# Patient Record
Sex: Male | Born: 1960 | Race: Black or African American | Hispanic: No | Marital: Single
Health system: Southern US, Community
[De-identification: ages and names within clinical notes are randomized; demographics above are authoritative.]

## PROBLEM LIST (undated history)

## (undated) DIAGNOSIS — E119 Type 2 diabetes mellitus without complications: Secondary | ICD-10-CM

---

## 2013-03-28 ENCOUNTER — Inpatient Hospital Stay: Payer: Self-pay | Admitting: Specialist

## 2013-03-28 LAB — CBC
HCT: 40.4 % (ref 40.0–52.0)
HGB: 13.5 g/dL (ref 13.0–18.0)
MCH: 28.3 pg (ref 26.0–34.0)
MCHC: 33.5 g/dL (ref 32.0–36.0)
MCV: 85 fL (ref 80–100)
Platelet: 220 10*3/uL (ref 150–440)
RBC: 4.78 10*6/uL (ref 4.40–5.90)
RDW: 13.1 % (ref 11.5–14.5)
WBC: 8.2 10*3/uL (ref 3.8–10.6)

## 2013-03-28 LAB — CK TOTAL AND CKMB (NOT AT ARMC)
CK, TOTAL: 190 U/L (ref 35–232)
CK, Total: 153 U/L (ref 35–232)
CK, Total: 144 U/L (ref 35–232)
CK-MB: 2 ng/mL (ref 0.5–3.6)
CK-MB: 1.8 ng/mL (ref 0.5–3.6)
CK-MB: 2.6 ng/mL (ref 0.5–3.6)

## 2013-03-28 LAB — BASIC METABOLIC PANEL
Anion Gap: 7 (ref 7–16)
BUN: 14 mg/dL (ref 7–18)
CO2: 27 mmol/L (ref 21–32)
CREATININE: 1.33 mg/dL — AB (ref 0.60–1.30)
Calcium, Total: 8.8 mg/dL (ref 8.5–10.1)
Chloride: 105 mmol/L (ref 98–107)
EGFR (Non-African Amer.): >60
Glucose: 121 mg/dL — ABNORMAL HIGH (ref 65–99)
Osmolality: 279 (ref 275–301)
Potassium: 3.4 mmol/L — ABNORMAL LOW (ref 3.5–5.1)
Sodium: 139 mmol/L (ref 136–145)

## 2013-03-28 LAB — TROPONIN I
TROPONIN-I: 0.08 ng/mL — AB
TROPONIN-I: 0.05 ng/mL
Troponin-I: 0.06 ng/mL — ABNORMAL HIGH

## 2013-03-29 LAB — CBC WITH DIFFERENTIAL/PLATELET
BASOS ABS: 0 10*3/uL (ref 0.0–0.1)
Basophil %: 0.5 %
EOS PCT: 2.8 %
Eosinophil #: 0.2 10*3/uL (ref 0.0–0.7)
HCT: 37.2 % — ABNORMAL LOW (ref 40.0–52.0)
HGB: 12.4 g/dL — AB (ref 13.0–18.0)
LYMPHS PCT: 39.4 %
Lymphocyte #: 2.8 10*3/uL (ref 1.0–3.6)
MCH: 28.2 pg (ref 26.0–34.0)
MCHC: 33.3 g/dL (ref 32.0–36.0)
MCV: 85 fL (ref 80–100)
MONO ABS: 0.5 x10 3/mm (ref 0.2–1.0)
MONOS PCT: 7.6 %
Neutrophil #: 3.5 10*3/uL (ref 1.4–6.5)
Neutrophil %: 49.7 %
Platelet: 198 10*3/uL (ref 150–440)
RBC: 4.4 10*6/uL (ref 4.40–5.90)
RDW: 13.6 % (ref 11.5–14.5)
WBC: 7.1 10*3/uL (ref 3.8–10.6)

## 2013-03-29 LAB — BASIC METABOLIC PANEL
Anion Gap: 3 — ABNORMAL LOW (ref 7–16)
BUN: 15 mg/dL (ref 7–18)
CHLORIDE: 107 mmol/L (ref 98–107)
Calcium, Total: 8.4 mg/dL — ABNORMAL LOW (ref 8.5–10.1)
Co2: 27 mmol/L (ref 21–32)
Creatinine: 0.98 mg/dL (ref 0.60–1.30)
GLUCOSE: 106 mg/dL — AB (ref 65–99)
Osmolality: 275 (ref 275–301)
POTASSIUM: 3.5 mmol/L (ref 3.5–5.1)
Sodium: 137 mmol/L (ref 136–145)

## 2013-03-29 LAB — MAGNESIUM: Magnesium: 1.8 mg/dL

## 2013-05-05 ENCOUNTER — Observation Stay: Payer: Self-pay | Admitting: Internal Medicine

## 2013-05-05 LAB — BASIC METABOLIC PANEL
ANION GAP: 6 — AB (ref 7–16)
BUN: 15 mg/dL (ref 7–18)
CALCIUM: 8.5 mg/dL (ref 8.5–10.1)
CHLORIDE: 108 mmol/L — AB (ref 98–107)
CREATININE: 1.31 mg/dL — AB (ref 0.60–1.30)
Co2: 26 mmol/L (ref 21–32)
EGFR (African American): >60
EGFR (Non-African Amer.): >60
Glucose: 96 mg/dL (ref 65–99)
Osmolality: 280 (ref 275–301)
Potassium: 3.9 mmol/L (ref 3.5–5.1)
SODIUM: 140 mmol/L (ref 136–145)

## 2013-05-05 LAB — CBC
HCT: 39 % — ABNORMAL LOW (ref 40.0–52.0)
HGB: 12.8 g/dL — AB (ref 13.0–18.0)
MCH: 28.4 pg (ref 26.0–34.0)
MCHC: 32.8 g/dL (ref 32.0–36.0)
MCV: 87 fL (ref 80–100)
PLATELETS: 224 10*3/uL (ref 150–440)
RBC: 4.51 10*6/uL (ref 4.40–5.90)
RDW: 14.7 % — ABNORMAL HIGH (ref 11.5–14.5)
WBC: 10.4 10*3/uL (ref 3.8–10.6)

## 2013-05-05 LAB — PRO B NATRIURETIC PEPTIDE: B-Type Natriuretic Peptide: 2025 pg/mL — ABNORMAL HIGH (ref 0–125)

## 2013-05-05 LAB — TROPONIN I
Troponin-I: 0.03 ng/mL
Troponin-I: 0.04 ng/mL
Troponin-I: 0.05 ng/mL

## 2013-05-06 LAB — LIPID PANEL
CHOLESTEROL: 185 mg/dL (ref 0–200)
HDL Cholesterol: 38 mg/dL — ABNORMAL LOW (ref 40–60)
LDL CHOLESTEROL, CALC: 121 mg/dL — AB (ref 0–100)
Triglycerides: 128 mg/dL (ref 0–200)
VLDL Cholesterol, Calc: 26 mg/dL (ref 5–40)

## 2013-05-06 LAB — BASIC METABOLIC PANEL
Anion Gap: 3 — ABNORMAL LOW (ref 7–16)
BUN: 22 mg/dL — ABNORMAL HIGH (ref 7–18)
Calcium, Total: 8.6 mg/dL (ref 8.5–10.1)
Chloride: 105 mmol/L (ref 98–107)
Co2: 30 mmol/L (ref 21–32)
Creatinine: 1.23 mg/dL (ref 0.60–1.30)
EGFR (Non-African Amer.): >60
Glucose: 85 mg/dL (ref 65–99)
Osmolality: 278 (ref 275–301)
POTASSIUM: 3.4 mmol/L — AB (ref 3.5–5.1)
SODIUM: 138 mmol/L (ref 136–145)

## 2013-05-06 LAB — CBC WITH DIFFERENTIAL/PLATELET
BASOS ABS: 0 10*3/uL (ref 0.0–0.1)
Basophil %: 0.4 %
EOS PCT: 1.6 %
Eosinophil #: 0.2 10*3/uL (ref 0.0–0.7)
HCT: 38.8 % — ABNORMAL LOW (ref 40.0–52.0)
HGB: 13 g/dL (ref 13.0–18.0)
LYMPHS PCT: 37.3 %
Lymphocyte #: 4.1 10*3/uL — ABNORMAL HIGH (ref 1.0–3.6)
MCH: 28.9 pg (ref 26.0–34.0)
MCHC: 33.4 g/dL (ref 32.0–36.0)
MCV: 87 fL (ref 80–100)
Monocyte #: 0.8 x10 3/mm (ref 0.2–1.0)
Monocyte %: 6.9 %
NEUTROS PCT: 53.8 %
Neutrophil #: 5.9 10*3/uL (ref 1.4–6.5)
Platelet: 227 10*3/uL (ref 150–440)
RBC: 4.49 10*6/uL (ref 4.40–5.90)
RDW: 15 % — AB (ref 11.5–14.5)
WBC: 10.9 10*3/uL — ABNORMAL HIGH (ref 3.8–10.6)

## 2014-05-03 IMAGING — CR DG CHEST 2V
1 series · 2 of 2 positions shown · non-contrast
Comparison: March 28, 2013

CLINICAL DATA: Chest pain

EXAM:
CHEST  2 VIEW

[Series 1: w chest pa · 0.14mm/px · 2 of 2 slices shown]
[im 1/2]
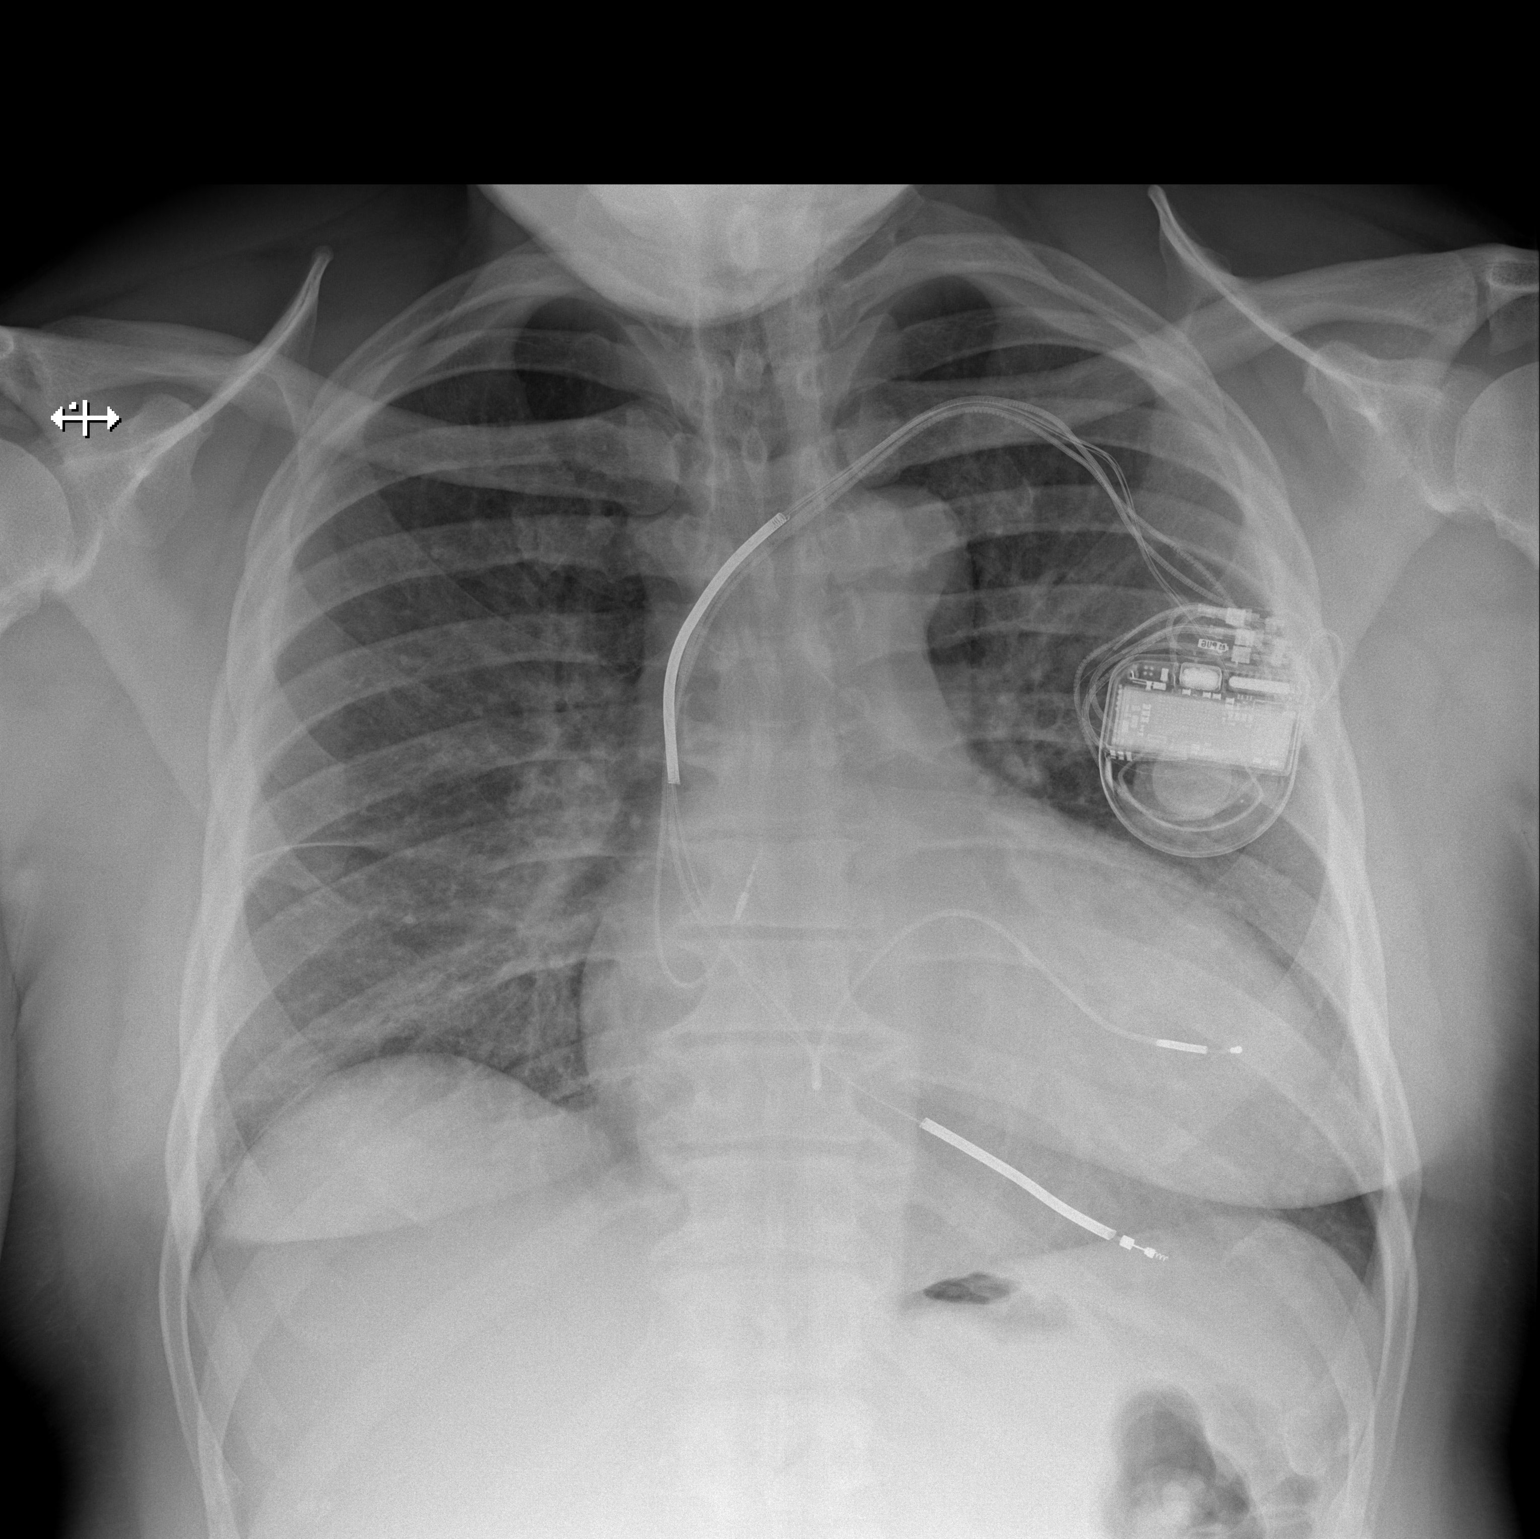
[im 2/2]
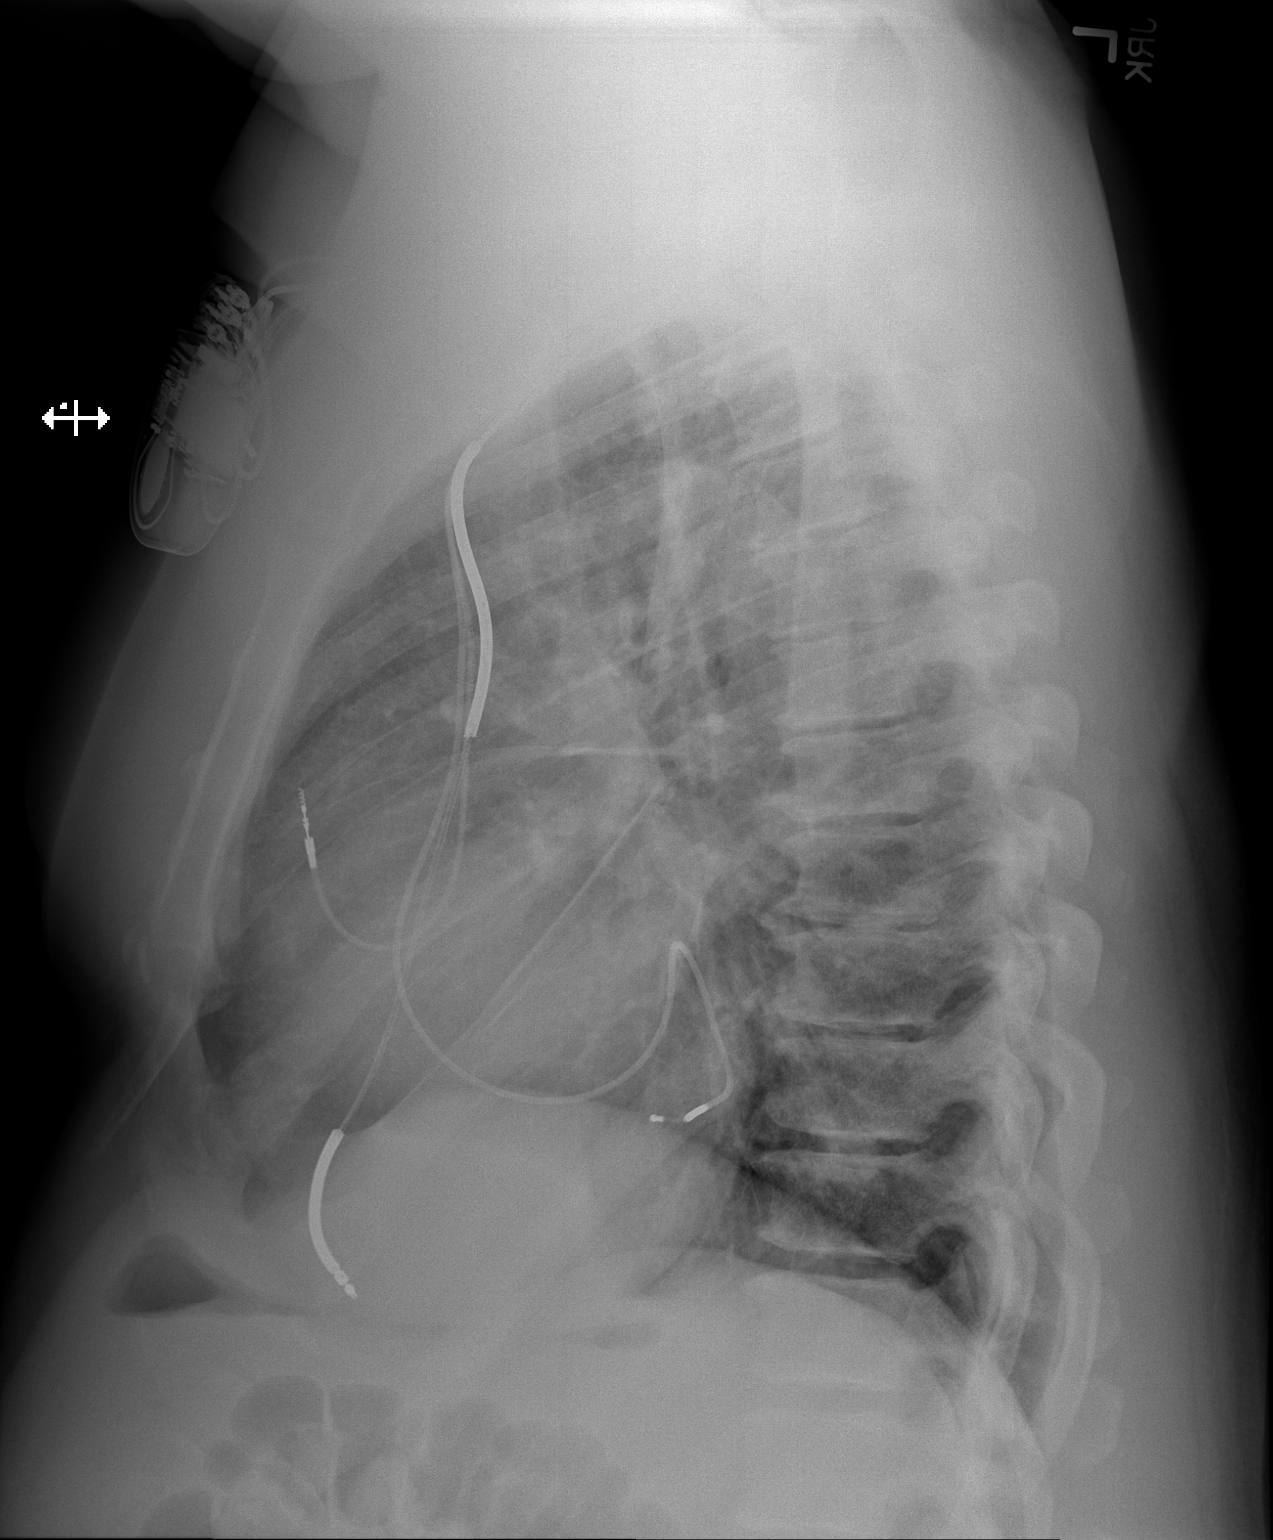

[2 of 2 positions shown; findings below may reference images not displayed]

FINDINGS: The mediastinal contour is normal. The heart size enlarged. Cardiac
pacemaker is unchanged. There is pulmonary edema. There is no focal
pneumonia or pleural effusion. The visualized skeletal structures
are stable.
IMPRESSION: Congestive heart failure.
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
## 2014-07-18 NOTE — H&P (Signed)
PATIENT NAME:  TAILOR, WESTFALL MR#:  [id number] DATE OF BIRTH:  March 09, 1961  DATE OF ADMISSION:  05/05/2013  PRIMARY CARE PHYSICIAN:  At [facility].   CARDIOLOGIST: Dr. Lady Gary.   CHIEF COMPLAINT: Not feeling good.   HISTORY OF PRESENT ILLNESS: This is a 54 year old man who states that he has not been feeling well. He has been having chest pain, needlelike in his chest, 5/10 in intensity, constant since yesterday, worse after eating. He has been having this chronic hip pain and difficulty getting around. He has had vomiting x1 and also some shortness of breath and some sweating.   In the ER, chest x-ray was positive for heart failure. BNP was elevated.   Of note, the patient has been off his Lasix for least a week. He states he does take some of the other medications.   When I evaluated the patient, the patient's pulse ox was good on room air. No respiratory distress. Will bring in as an observation.   PAST MEDICAL HISTORY: Nonischemic cardiomyopathy, diabetes, hypertension, hyperlipidemia.   PAST SURGICAL HISTORY: AICD.   ALLERGIES: No known drug allergies.   MEDICATIONS: As per prescription writer, include aspirin 81 mg daily, Coreg 25 mg twice a day, Lasix 40 mg daily, Imdur 30 mg daily. Lantus, he states 60 units, but in the prescription writer, it is down to 10 units. Lisinopril 20 mg daily, simvastatin 20 mg daily   SOCIAL HISTORY: Smokes 1 pack per three days. No alcohol. No drug use. Used to work as a Curator.   FAMILY HISTORY: Mother died of diabetic complications. Father died at age 5 of heart disease. Sister had a heart transplant, died at age 89.   REVIEW OF SYSTEMS:  CONSTITUTIONAL: Positive for sweating. No fever. No chills. Positive for fatigue.  EYES: Does wear glasses.  ENT: No hearing loss. No sore throat. No difficulty swallowing.  CARDIOVASCULAR: Positive for chest pain. No palpitations.  RESPIRATORY: Positive for shortness of breath. Positive for cough. No sputum. No  hemoptysis.  GASTROINTESTINAL: Positive for nausea. Positive vomiting. No abdominal pain. No diarrhea. No constipation. No bright red blood per rectum. No melena.  GENITOURINARY: No burning on urination. No hematuria.  MUSCULOSKELETAL: Positive for right hip pain and groin pain going down the front of the leg down to the foot, needlelike.  PSYCHIATRIC: History of depression.  INTEGUMENT: No rashes or eruptions.  NEUROLOGIC: No fainting or blackouts.  ENDOCRINE: No thyroid problems.  HEMOLYMPHATIC: No anemia. No easy bruising or bleeding.   PHYSICAL EXAMINATION:  VITAL SIGNS: Temperature 97.6, pulse 84, respirations 20, blood pressure 146/78, pulse oximetry 96% on room air.  GENERAL: No respiratory distress.  EYES: Conjunctivae and lids normal. Pupils equal, round, and reactive to light. Extraocular muscles intact. No nystagmus.  ENT: Tympanic membranes: No erythema. Nasal mucosa: No erythema.  THROAT: No erythema, no exudate seen.  LIPS AND GUMS: No lesions.  NECK: No JVD. No bruits. No lymphadenopathy. No thyromegaly. No thyroid nodules palpated.  LUNGS: Clear to auscultation. No use of accessory muscles to breathe. No rhonchi, rales, or wheeze heard.  CARDIOVASCULAR: S1, S2 normal. No gallops, rubs, or murmurs heard. Carotid upstroke 2+ bilaterally. No bruits. Dorsalis pedal pulses 2+ bilaterally. Trace edema of the lower extremity. CHEST WALL: Positive pain to palpation of the xiphoid process.  ABDOMEN: Soft, nontender. No organomegaly/splenomegaly. Normoactive bowel sounds. No masses felt.  LYMPHATIC: No lymph nodes in the neck.  MUSCULOSKELETAL: Trace edema. No clubbing. No cyanosis. Positive pain to right hip area when  flexing and internally rotating.  SKIN: No ulcers or lesions seen.  NEUROLOGIC: Cranial nerves II through XII grossly intact. Deep tendon reflexes 2+ bilateral lower extremities.  PSYCHIATRIC: Oriented to person, place, and time.   LABORATORY AND RADIOLOGICAL DATA:  Glucose 96, BUN 15, creatinine 1.31, sodium 140, potassium 3.9, chloride 108, CO2 26, calcium 8.5. White blood cell count 10.4, H and H  12.8 and 39.0, platelet count of 224. Troponin negative.   Chest x-ray shows heart failure.   BNP 2025, second troponin was actually negative also.   EKG: Paced.   ASSESSMENT AND PLAN:  1.  Acute-on-chronic systolic congestive heart failure with cardiomyopathy. I did see the patient after the Lasix. I will give another dose of IV Lasix today, restart his medications, admit as an observation, get a cardiology consultation, get a third cardiac enzyme. Close clinical follow-up as outpatient will be needed upon discharge. Compliance with medication will be needed or else the patient will have numerous hospitalizations.  2.  Chest pain, likely inflammatory process of the xiphoid. I will give 1 dose of prednisone today. Could also be gastroesophageal reflux disease. We will give proton pump inhibitor.  3.  Diabetes. Prescription writer says he takes 10 units of Lantus. Will start with that and sliding scale.  4.  Hypertension. Blood pressure is slightly elevated. We will restart medications and monitor.  5.  Hyperlipidemia. Continue Zocor.  6.  Chronic right hip pain. We will get an x-ray and physical therapy evaluation.   TIME SPENT ON ADMISSION: 55 minutes.   ____________________________ Herschell Dimesichard J. Renae GlossWieting, MD rjw:np D: 05/05/2013 15:43:45 ET T: 05/05/2013 16:15:00 ET JOB#: [id number]  cc: Herschell Dimesichard J. Renae GlossWieting, MD, <Dictator> Darlin PriestlyKenneth A. Lady GaryFath, MD Salley ScarletICHARD J Konstantina Nachreiner MD ELECTRONICALLY SIGNED 05/17/2013 12:16

## 2014-07-18 NOTE — Consult Note (Signed)
   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 He was recently admitted in December with similar complaints. He had been off of all of his meds for at least a month at that time. He was placed back on his meds. He now returns with increased shortness of breath. CXR suggests mild pulmonary edema. He is able to lay flat however. He states his hips are hurting also. He had some chest pain but cardiac [place] in [place] in early December revealed no significant disease. Symptoms are likley secondary to noncompliance.   Physical Exam:  GEN obese   HEENT PERRL, moist oral mucosa   NECK supple  No masses   RESP normal resp effort  clear BS   CARD Regular rate and rhythm  Murmur   Murmur Systolic   Systolic Murmur axilla   ABD denies tenderness  normal BS   LYMPH negative neck, negative axillae   EXTR negative cyanosis/clubbing, positive edema   SKIN normal to palpation   NEURO cranial nerves intact, motor/sensory function intact   PSYCH A+O to time, place, person   Review of Systems:  Subjective/Chief Complaint shortness of breath and chest pain   General: No Complaints  Fatigue   Skin: No Complaints   ENT: No Complaints   Eyes: No Complaints   Neck: No Complaints   Respiratory: Short of breath   Cardiovascular: Chest pain or discomfort  Dyspnea  Edema   Gastrointestinal: No Complaints   Genitourinary: No Complaints   Vascular: No Complaints   Musculoskeletal: No Complaints   Neurologic: No Complaints   Hematologic: No Complaints   Endocrine: No Complaints   Psychiatric: No Complaints   Review of Systems: All other systems were reviewed and found to be negative   Medications/Allergies Reviewed Medications/Allergies reviewed   Home Medications: Medication Instructions Status  isosorbide mononitrate 30 mg oral tablet, extended release 1 tab(s) orally once a day (in  the morning) Active  furosemide 40 mg oral tablet 1 tab(s) orally once a day Active  Aspirin Enteric Coated 81 mg oral delayed release tablet 1 tab(s) orally once a day Active  carvedilol 25 mg oral tablet 1 tab(s) orally 2 times a day Active  simvastatin 20 mg oral tablet 1 tab(s) orally once a day (at bedtime) Active  lisinopril 20 mg oral tablet 1 tab(s) orally once a day Active  Lantus Solostar Pen 100 units/mL subcutaneous solution 10  subcutaneous once a day Active   EKG:  Abnormal NSSTTW changes    No Known Allergies:    Impression 54 yo male with history of idiopathic cardiomyopathy who is admitted iwth progressive shortness of breath. He states he has been noncompliant with his meds. He has imporved with iv lasix. WOuld recommend resuming his home meds after several doses of  iv lasix. Discussed importance of taking meds.   Plan 1. Resume beta blockers, nitreates diuretics 2. Low sodium diet. 3. Ambulate in am  4. Consider discharge if stable with outpatient follow up   Electronic Signatures: Dalia HeadingFath, Kenneth A (MD)  (Signed 09-Feb-15 15:21)  Authored: General Aspect/Present Illness, History and Physical Exam, Review of System, Home Medications, EKG , Allergies, Impression/Plan   Last Updated: 09-Feb-15 15:21 by Dalia HeadingFath, Kenneth A (MD)

## 2014-07-18 NOTE — Discharge Summary (Signed)
PATIENT NAME:  Devin Huber, Devin Huber MR#:  [id number] DATE OF BIRTH:  1960/07/21  DATE OF ADMISSION:  03/28/2013 DATE OF DISCHARGE:  03/29/2013  For a detailed note, please see the history and physical done on admission by Dr. Randol KernElgergawy.   DIAGNOSES AT DISCHARGE:  1.  Chest pain, likely musculoskeletal in nature. 2.  Elevated troponin, likely in the setting of demand ischemia and no evidence of acute coronary syndrome. 3.  A history of diabetes.  4.  A history of ischemic cardiomyopathy, ejection fraction of 25%. 5.  A history of chronic systolic congestive heart failure  6.  Hyperlipidemia.   DIET:  The patient is being discharged on American Diabetic Association low-sodium, low-fat diet.   ACTIVITY:  As tolerated.   FOLLOWUP:   Dr. Mariel KanskyKen Fath next 1 to 2 weeks.   DISCHARGE MEDICATIONS AS FOLLOWS: 1.  Imdur 30 mg daily.  2.  Lasix 40 mg daily.  3.  Aspirin 81 mg daily.  4.  Coreg 25 mg b.i.d.  5.  Celexa 10 mg daily.  6.  Lisinopril 5 mg daily.  7.  Simvastatin 20 mg daily. 8.  Lantus pen at bedtime.   CONSULTANTS DURING THE HOSPITAL COURSE:  Dr. Mariel KanskyKen Fath from Cardiology.   PERTINENT STUDIES DONE DURING THE HOSPITAL COURSE:  A CT scan of the chest done with contrast showing no evidence for acute pulmonary embolism, severe cardiomegaly with LVH. A chest x-ray done on admission showing no acute disease. An echocardiogram of the heart showing EF of 20% to 25%, severely decreased global LV systolic function, moderate to severely increased LV cavity size, moderate LVH.   HOSPITAL COURSE:  This is a 54 year old male with medical problems as mentioned above, presented to the hospital with chest pain.  1.  Chest pain. Given the patient's risk factors of diabetes and history of cardiomyopathy, the patient was admitted overnight and was observed on telemetry, had 3 sets of cardiac markers checked, which were negative. The patient is currently chest pain-free and hemodynamically stable. He also had a  recent cardiac catheter[place] in [place] a few weeks back, which showed no significant coronary artery disease amenable to intervention. The patient was seen by Cardiology who recommended medical management at this point. The patient is, therefore, being discharged on his aspirin, beta blocker, Imdur and statin. If he continues to have further chest pain, Ranexa could possibly be added.  2.  Hypertension. The patient remained hemodynamically stable on his Coreg and lisinopril. He will resume. 3.  Diabetes. The patient was maintained on sliding scale insulin and his Lantus. He will resume that.  4.  Depression. The patient was maintained on her Celexa. He will continue that.  5.  Hyperlipidemia. The patient was maintained on his simvastatin and he will resume that too.   The patient has recently [place] here from [place], does not have a primary care physician. He needs to get himself a primary care physician established. He understands this. This is likely to be done within next week or so. For now, he will follow up with Cardiology, Dr. Lady GaryFath, as an outpatient.   TIME SPENT:  40 minutes.   ____________________________ Rolly PancakeVivek J. Cherlynn KaiserSainani, MD vjs:jm D: 03/29/2013 16:06:19 ET T: 03/29/2013 16:42:45 ET JOB#: [id number]  cc: Rolly PancakeVivek J. Cherlynn KaiserSainani, MD, <Dictator> Darlin PriestlyKenneth A. Lady GaryFath, MD Houston SirenVIVEK J Nosson Wender MD ELECTRONICALLY SIGNED 04/23/2013 11:18

## 2014-07-18 NOTE — Consult Note (Signed)
Brief Consult Note: Diagnosis: 54 yo male with history of idiopathic cardiomyopathy admitted with chest pain and sob after traveling from [place]. Chest ct negative for pe. Trivial troponin elevation. Had radial appraoch cath in [place] recently showing no evidene of cad per his report. EF was 20-25%.   Patient was seen by consultant.   Recommend further assessment or treatment.   Comments: Pt with history of apparent idiiopathic cardiomyopathy after invasive and nonivansive workup in [place]. EF was 20-25%. He is on afterload reduction, carvedilol and lasix. Minimal troponin eelvation which appears to be seocnary to his chronic systollic chf. No chf on cxr. Would continue with carvedilol, ace i and lasix. AMbualte. Cath report not in packet that came from [place]. Will attempt to get this report.  Electronic Signatures: Dalia HeadingFath, Kenneth A (MD)  (Signed 02-Jan-15 15:38)  Authored: Brief Consult Note   Last Updated: 02-Jan-15 15:38 by Dalia HeadingFath, Kenneth A (MD)

## 2014-07-18 NOTE — H&P (Signed)
PATIENT NAME:  Devin Huber, FERRARI MR#:  [id number] DATE OF BIRTH:  [birth year]  DATE OF ADMISSION:  03/28/2013  REFERRING PHYSICIAN: Dr. Bayard Males.   PRIMARY CARE PHYSICIAN: The patient is visiting from [place], [place]. His physician is in [place].   CHIEF COMPLAINT: Chest pain.   HISTORY OF PRESENT ILLNESS: This is a 54 year old male with significant past medical history of coronary artery disease, status post myocardial infarction and AICD placement, diabetes, hypertension, hyperlipidemia, who presents with complaints of chest pain. The patient reports his pain started a few hours ago, midsternal, sharp quality and nonradiating. He reports some mild shortness of breath with it. He also reports some mild diaphoresis. Denies nausea, vomiting, sweating, palpitation, cough, productive sputum, hemoptysis, or weakness or dizziness. The patient reports he has been complaining of a different kind of chest pain in the past, where he reports he had a cardiac catheterization done last month in [place], which he was told it was negative. The patient's EKG showing paced rhythm. His first troponin is 0.08. The patient was given 324 mg of aspirin. He denies any chest pain currently. The patient reports he drove from [place] yesterday. He reported it took him more than 1 day as he was resting in between. He drove around 8 to 10 hours. He denies any calf tenderness, any hemoptysis or any history of deep vein thrombosis or PE in the past. Hospitalist services were requested to admit the patient for further evaluation of his chest pain.   PAST MEDICAL HISTORY:  1.  Coronary artery disease.  2.  Diabetes.  3.  Hypertension.  4.  Hyperlipidemia.  5.  AICD.   PAST SURGICAL HISTORY: AICD.    SOCIAL HISTORY: The patient smokes 1 pack per day lasting for up to 3 days. No alcohol. No illicit drug use.   FAMILY HISTORY: Negative for coronary artery disease.   ALLERGIES: No known drug allergies.   HOME  MEDICATIONS:   1.  Aspirin 81 mg oral daily.  2.  Lisinopril 5 mg oral daily.  3.  Isosorbide mononitrate 30 mg oral daily.  4.  Citalopram 10 mg oral daily.  5.  Lantus 20 units subcutaneous daily.  6.  Simvastatin 20 mg oral at bedtime.  7.  Coreg 25 mg oral 2 times a day.  8.  Lasix 40 mg oral daily.   REVIEW OF SYSTEMS: CONSTITUTIONAL: Denies fever, chills, fatigue, weakness, weight gain, weight loss. EYES: Denies blurry vision, double vision, inflammation, glaucoma. EARS, NOSE, THROAT: Denies tinnitus, ear pain, hearing loss or epistaxis. RESPIRATORY: Denies cough, wheezing, hemoptysis or chronic obstructive pulmonary disease. Reports mild dyspnea. CARDIOVASCULAR: Complains of chest pain, resolved. Denies any edema, arrhythmia, palpitations or syncope. GASTROINTESTINAL: Denies nausea, vomiting, diarrhea, abdominal pain, hematemesis or melena. GENITOURINARY: Denies dysuria, hematuria, or renal colic.  ENDOCRINE: Denies polyuria, polydipsia, heat or cold intolerance. HEMATOLOGY: Denies anemia, easy bruising or bleeding diathesis. INTEGUMENTARY: Denies acne, rash or skin lesion.  MUSCULOSKELETAL: Denies any arthritis, cramps or gout. NEUROLOGIC: Denies CVA, TIA, ataxia, dementia, headache. PSYCHIATRIC: Denies anxiety, insomnia or bipolar disorder.   PHYSICAL EXAMINATION:  VITAL SIGNS: Temperature 99.5, pulse 74, respiratory rate 18, blood pressure 99/47 and saturating  95% on room air.  GENERAL: Well-nourished male who looks comfortable in no apparent distress.  HEENT: Head atraumatic, normocephalic. Pupils equal and reactive to light. Pink conjunctivae. Anicteric sclerae. Moist oral mucosa.  NECK: Supple. No thyromegaly. No JVD.  CHEST: Good air entry bilaterally. No wheezing, rales, rhonchi.  CARDIOVASCULAR: S1, S2 heard. No rubs, murmurs or gallops.  ABDOMEN: Soft, nontender, nondistended. Bowel sounds present.  EXTREMITIES: +1 edema bilaterally. No clubbing. No cyanosis. Pedal pulses felt  bilaterally.  PSYCHIATRIC: Appropriate affect. Awake, alert x 3. Intact judgment and insight.  NEUROLOGIC: Cranial nerves grossly intact. Motor 5 out of 5. No focal deficits.  MUSCULOSKELETAL: No joint effusion or erythema. LYMPHATICS: No cervical lymphadenopathy.   PERTINENT LABS: Glucose 121, BUN 14, creatinine 1.33, sodium 139, potassium 3.4, chloride 105, CO2 27. Troponin 0.08. White blood cell 8.2, hemoglobin 13.5, hematocrit 40.4, platelet 220. EKG showing electronic paced rhythm at 79 beats per minute.   ASSESSMENT AND PLAN:  1.  Chest pain. The patient had musculoskeletal chest pain quality that is reproducible by palpation, but given his history of recent travel we will need to rule out pulmonary embolus, so we will obtain CT angiogram chest to rule out pulmonary embolus. As well, given the fact he has mild elevated troponin, he will be admitted to telemetry unit. Continue to cycle his cardiac enzymes and follow the trend. He will be given 324 mg of aspirin. Given the fact he has mildly  elevated troponin, but he reports he had normal cardiac catheter, so I will not start him on any subcutaneous Lovenox. I requested records from [place] and I am awaiting the records and will follow the trend. Will consult cardiology.  2.  Diabetes mellitus. We will hold his Lantus. We will have him on insulin sliding scale.  3.  Hypokalemia. We will replace.  4.  History of hypertension. His blood pressure is on the lower side. We will resume him back on lisinopril, Imdur and Coreg. We will hold Lasix.  5.  Hyperlipidemia. Continue with statin.  6.  Coronary artery disease. Continue with aspirin and/or statin, lisinopril and Coreg. 7.  Tobacco abuse. The patient was counseled for 3 minutes.  8.  Deep vein thrombosis prophylaxis. Subcutaneous heparin prophylaxis on proton pump inhibitor.   CODE STATUS: Full code.   TOTAL TIME SPENT ON ADMISSION AND PATIENT CARE: 55 minutes.    ____________________________ Starleen Armsawood S. Kayson Bullis, MD dse:aw D: 03/28/2013 06:32:14 ET T: 03/28/2013 06:42:07 ET JOB#: [id number]  cc: Starleen Armsawood S. Ashyah Quizon, MD, <Dictator> Chesky Heyer Teena IraniS Jesiah Yerby MD ELECTRONICALLY SIGNED 03/30/2013 4:48

## 2017-06-24 ENCOUNTER — Emergency Department

## 2017-06-24 ENCOUNTER — Encounter: Payer: Self-pay | Admitting: Radiology

## 2017-06-24 ENCOUNTER — Emergency Department
Admission: EM | Admit: 2017-06-24 | Discharge: 2017-06-24 | Disposition: A | Discharge status: Home / Self Care | Attending: Emergency Medicine | Admitting: Emergency Medicine

## 2017-06-24 ENCOUNTER — Other Ambulatory Visit: Payer: Self-pay

## 2017-06-24 DIAGNOSIS — E119 Type 2 diabetes mellitus without complications: Secondary | ICD-10-CM | POA: Diagnosis not present

## 2017-06-24 DIAGNOSIS — I5020 Unspecified systolic (congestive) heart failure: Secondary | ICD-10-CM

## 2017-06-24 DIAGNOSIS — R109 Unspecified abdominal pain: Secondary | ICD-10-CM | POA: Insufficient documentation

## 2017-06-24 DIAGNOSIS — R079 Chest pain, unspecified: Secondary | ICD-10-CM

## 2017-06-24 DIAGNOSIS — R51 Headache: Secondary | ICD-10-CM | POA: Insufficient documentation

## 2017-06-24 DIAGNOSIS — R590 Localized enlarged lymph nodes: Secondary | ICD-10-CM

## 2017-06-24 DIAGNOSIS — R778 Other specified abnormalities of plasma proteins: Secondary | ICD-10-CM

## 2017-06-24 DIAGNOSIS — R7989 Other specified abnormal findings of blood chemistry: Secondary | ICD-10-CM

## 2017-06-24 HISTORY — DX: Type 2 diabetes mellitus without complications: E11.9

## 2017-06-24 LAB — DIFFERENTIAL
BASOS ABS: 0 10*3/uL (ref 0–0.1)
BASOS PCT: 1 %
EOS ABS: 0.1 10*3/uL (ref 0–0.7)
Eosinophils Relative: 1 %
LYMPHS ABS: 1.9 10*3/uL (ref 1.0–3.6)
Lymphocytes Relative: 21 %
Monocytes Absolute: 0.6 10*3/uL (ref 0.2–1.0)
Monocytes Relative: 7 %
NEUTROS ABS: 6.2 10*3/uL (ref 1.4–6.5)
NEUTROS PCT: 70 %

## 2017-06-24 LAB — CBC
HEMATOCRIT: 41.2 % (ref 40.0–52.0)
Hemoglobin: 13.5 g/dL (ref 13.0–18.0)
MCH: 28.5 pg (ref 26.0–34.0)
MCHC: 32.7 g/dL (ref 32.0–36.0)
MCV: 87.3 fL (ref 80.0–100.0)
PLATELETS: 280 10*3/uL (ref 150–440)
RBC: 4.72 MIL/uL (ref 4.40–5.90)
RDW: 14.8 % — AB (ref 11.5–14.5)
WBC: 9.2 10*3/uL (ref 3.8–10.6)

## 2017-06-24 LAB — BASIC METABOLIC PANEL
Anion gap: 11 (ref 5–15)
BUN: 15 mg/dL (ref 6–20)
CHLORIDE: 109 mmol/L (ref 101–111)
CO2: 20 mmol/L — AB (ref 22–32)
CREATININE: 1.17 mg/dL (ref 0.61–1.24)
Calcium: 9.1 mg/dL (ref 8.9–10.3)
GFR calc Af Amer: >60 mL/min (ref >60)
GFR calc non Af Amer: >60 mL/min (ref >60)
GLUCOSE: 96 mg/dL (ref 65–99)
POTASSIUM: 3.8 mmol/L (ref 3.5–5.1)
SODIUM: 140 mmol/L (ref 135–145)

## 2017-06-24 LAB — TROPONIN I
Troponin I: 0.06 ng/mL (ref <0.03)
Troponin I: 0.07 ng/mL (ref <0.03)

## 2017-06-24 LAB — HEPATIC FUNCTION PANEL
ALT: 20 U/L (ref 17–63)
AST: 24 U/L (ref 15–41)
Albumin: 3.7 g/dL (ref 3.5–5.0)
Alkaline Phosphatase: 105 U/L (ref 38–126)
BILIRUBIN DIRECT: 0.1 mg/dL (ref 0.1–0.5)
BILIRUBIN TOTAL: 0.8 mg/dL (ref 0.3–1.2)
Indirect Bilirubin: 0.7 mg/dL (ref 0.3–0.9)
Total Protein: 7.6 g/dL (ref 6.5–8.1)

## 2017-06-24 LAB — LIPASE, BLOOD: Lipase: 22 U/L (ref 11–51)

## 2017-06-24 LAB — FIBRIN DERIVATIVES D-DIMER (ARMC ONLY): Fibrin derivatives D-dimer (ARMC): 1704.5 ng/mL (FEU) — ABNORMAL HIGH (ref 0.00–499.00)

## 2017-06-24 LAB — INFLUENZA PANEL BY PCR (TYPE A & B)
INFLAPCR: NEGATIVE
INFLBPCR: NEGATIVE

## 2017-06-24 MED ORDER — IOPAMIDOL (ISOVUE-300) INJECTION 61%
30.0000 mL | Freq: Once | INTRAVENOUS | Status: AC | PRN
  Administered 2017-06-24: 30 mL via ORAL

## 2017-06-24 MED ORDER — HYDROCODONE-ACETAMINOPHEN 5-325 MG PO TABS: 1.0000 | ORAL_TABLET | ORAL | 0 refills | Status: AC | PRN

## 2017-06-24 MED ORDER — ONDANSETRON HCL 4 MG/2ML IJ SOLN
4.0000 mg | Freq: Once | INTRAMUSCULAR | Status: AC
  Administered 2017-06-24: 4 mg via INTRAVENOUS
  Filled 2017-06-24: qty 2

## 2017-06-24 MED ORDER — MORPHINE SULFATE (PF) 4 MG/ML IV SOLN
4.0000 mg | Freq: Once | INTRAVENOUS | Status: AC
  Administered 2017-06-24: 4 mg via INTRAVENOUS
  Filled 2017-06-24: qty 1

## 2017-06-24 MED ORDER — IOPAMIDOL (ISOVUE-370) INJECTION 76%
100.0000 mL | Freq: Once | INTRAVENOUS | Status: AC | PRN
  Administered 2017-06-24: 100 mL via INTRAVENOUS

## 2017-06-24 MED ORDER — NITROGLYCERIN 0.4 MG SL SUBL
0.40 mg | SUBLINGUAL_TABLET | SUBLINGUAL | Status: DC
Start: ? — End: 2017-06-24

## 2017-06-24 MED ORDER — FUROSEMIDE 40 MG PO TABS: 40.0000 mg | ORAL_TABLET | Freq: Every day | ORAL | 11 refills | Status: AC

## 2017-06-24 NOTE — ED Provider Notes (Addendum)
[facility] Emergency Department Provider Note   ____________________________________________   First MD Initiated Contact with Patient 06/24/17 1251     (approximate)  I have reviewed the triage vital signs and the nursing notes.   HISTORY  Chief Complaint Chest Pain    HPI Devin Huber is a 57 y.o. male who comes in complaining of chest pain sharp left-sided pain with no radiation nothing makes it better or worse it started this morning. He had it several times in the past was never diagnosed with any medical problems he was at [facility] on the 29th through 31st and was sent home. He has also had heart attacks and a pacemaker but this doesn't feel like that. The headache last night it got gradually worse and is now quite bad today. Not the worst of his life. He also has had left-sided abdominal pain since yesterday with nausea and vomiting but no diarrhea. He is not running a fever and has not run a fever.patient is breathing rapidly and complains of numbness in the lips and both hands. patient was sitting in wheelchair and did slump over in a wheelchair for about 10 seconds he seemed to be unresponsive and then woke right back up. He did not have any seizure symptoms or having vomiting when he did that and it is not changed during that time.   Past Medical History:  Diagnosis Date  . Diabetes mellitus without complication (HCC)    past medical history is pacemaker in place MI in past 5 per old records There are no active problems to display for this patient.     Prior to Admission medications   Not on File    Allergies Patient has no allergy information on record.  No family history on file.  Social History Social History   Tobacco Use  . Smoking status: Not on file  Substance Use Topics  . Alcohol use: Not on file  . Drug use: Not on file    Review of Systems  Eyes: No visual changes. ENT: No sore throat. Cardiovascular: s chest  pain. Respiratory: Denies shortness of breath. Gastrointestinal: No abdominal pain.  No nausea, no vomiting.  No diarrhea.  No constipation. Genitourinary: Negative for dysuria. Musculoskeletal: Negative for back pain. Skin: Negative for rash. Neurological: Negative for headaches, focal weakness   ____________________________________________   PHYSICAL EXAM:  VITAL SIGNS: ED Triage Vitals  Enc Vitals Group     BP 06/24/17 1242 (!) 155/88     Pulse Rate 06/24/17 1242 77     Resp 06/24/17 1242 (!) 22     Temp 06/24/17 1242 98.2 F (36.8 C)     Temp Source 06/24/17 1242 Oral     SpO2 06/24/17 1242 95 %     Weight 06/24/17 1243 235 lb (106.6 kg)     Height 06/24/17 1243 5\' 9"  (1.753 m)     Head Circumference --      Peak Flow --      Pain Score 06/24/17 1243 7     Pain Loc --      Pain Edu? --      Excl. in GC? --     Constitutional: Alert and oriented. Well appearing and in no acute distress. Eyes: Conjunctivae are normal.  Head: Atraumatic. Nose: No congestion/rhinnorhea. Mouth/Throat: Mucous membranes are moist.  Oropharynx non-erythematous. Neck: No stridor.  Cardiovascular: Normal rate, regular rhythm. Grossly normal heart sounds.  Good peripheral circulation. Respiratory: Normal respiratory effort.  No retractions.  Lungs CTAB. Gastrointestinal: Soft and nontender. No distention. No abdominal bruits. No CVA tenderness. Musculoskeletal: No lower extremity tenderness nor edema.   Neurologic:  Normal speech and language. No gross focal neurologic deficits are appreciated. Skin:  Skin is warm, dry and intact. No rash noted. Psychiatric: Mood and affect are normal. Speech and behavior are normal.  ____________________________________________   LABS (all labs ordered are listed, but only abnormal results are displayed)  Labs Reviewed  BASIC METABOLIC PANEL - Abnormal; Notable for the following components:      Result Value   CO2 20 (*)    All other components within  normal limits  CBC - Abnormal; Notable for the following components:   RDW 14.8 (*)    All other components within normal limits  TROPONIN I - Abnormal; Notable for the following components:   Troponin I 0.06 (*)    All other components within normal limits  FIBRIN DERIVATIVES D-DIMER ([facility] ONLY) - Abnormal; Notable for the following components:   Fibrin derivatives D-dimer ([facility]) 1,704.50 (*)    All other components within normal limits  TROPONIN I - Abnormal; Notable for the following components:   Troponin I 0.07 (*)    All other components within normal limits  HEPATIC FUNCTION PANEL  INFLUENZA PANEL BY PCR (TYPE A & B)  LIPASE, BLOOD  DIFFERENTIAL   ____________________________________________  EKG  EKG read and interpreted by me shows a ventricularly paced rhythm at a rate of 77 with occasional PVCs  and no acute ST-T changes EKG #2 read and interpreted by me shows a fully paced rhythm with occasional PVCs. Rate for this EKG is 68. There are no acute changes. ____________________________________________  RADIOLOGY  ED MD interpretation:  chest x-ray shows some vague densities in the right lung.  Official radiology report(s): Ct Head Wo Contrast  Result Date: 06/24/2017 CLINICAL DATA:  Headache. EXAM: CT HEAD WITHOUT CONTRAST TECHNIQUE: Contiguous axial images were obtained from the base of the skull through the vertex without intravenous contrast. COMPARISON:  None FINDINGS: Brain: No hemorrhage, hydrocephalus, intra-axial, or extra-axial fluid collection. Hypoattenuation involving the left PCA territory, including image 14/2. No significant mass effect. Concurrent ex vacuo dilatation of the occipital horn of the left lateral ventricle. Vascular: No hyperdense vessel or unexpected calcification. Skull: Normal Sinuses/Orbits: Normal imaged portions of the orbits and globes. Hypoplastic frontal sinuses. Clear mastoid air cells. Other: None. IMPRESSION: 1.  No acute intracranial  abnormality. 2. Nonacute left PCA distribution infarct. Electronically Signed   By: Jeronimo Greaves M.D.   On: 06/24/2017 14:28   Ct Angio Chest Pe W And/or Wo Contrast  Result Date: 06/24/2017 CLINICAL DATA:  Shortness of breath and chest pain.  Abdominal pain. EXAM: CT ANGIOGRAPHY CHEST CT ABDOMEN AND PELVIS WITH CONTRAST TECHNIQUE: Multidetector CT imaging of the chest was performed using the standard protocol during bolus administration of intravenous contrast. Multiplanar CT image reconstructions and MIPs were obtained to evaluate the vascular anatomy. Multidetector CT imaging of the abdomen and pelvis was performed using the standard protocol during bolus administration of intravenous contrast. CONTRAST:  ISOVUE-370 IOPAMIDOL (ISOVUE-370) INJECTION 76% COMPARISON:  CTA chest 03/28/2013 FINDINGS: CTA CHEST FINDINGS Cardiovascular: Mild cardiac enlargement. Mild aortic atherosclerosis. Left chest wall ICD is noted with lead in the right atrial appendage, coronary sinus and right ventricle. The main pulmonary artery appears patent. No central obstructing pulmonary emboli identified. No lobar or segmental pulmonary artery filling defects identified. Mediastinum/Nodes: Normal appearance of the thyroid gland. The trachea appears  patent and is midline. Normal appearance of the esophagus. Enlarged subcarinal lymph node measures 2.6 cm, image 41/9. Previously 1.6 cm. Right hilar lymph node measures 1.8 cm, image 46/9. Previously 1.6 cm. 2.6 cm right hilar lymph node is identified, image 35/9. Previously 2.1 cm. Right paratracheal lymph node measures 1.6 cm, image 29/9. Previously 0.7 cm. Left paratracheal lymph node measures 1.4 cm, image 28/9. Previously 0.6 cm. Lungs/Pleura: Small pleural effusions noted. There are diffuse bilateral ground-glass opacifications and lower lobe predominant interstitial thickening noted. No airspace consolidation, atelectasis or pneumothorax. Right lower lobe perifissural nodule  measures 5 mm, image 45/11. Stable from previous exam. Unchanged right middle lobe pulmonary nodule measuring 6 mm, image 43/11. Musculoskeletal: Degenerative disc disease noted throughout the thoracic spine. Review of the MIP images confirms the above findings. CT ABDOMEN and PELVIS FINDINGS Hepatobiliary: No focal liver abnormality is seen. No gallstones, gallbladder wall thickening, or biliary dilatation. Pancreas: Unremarkable. No pancreatic ductal dilatation or surrounding inflammatory changes. Spleen: Normal in size without focal abnormality. Adrenals/Urinary Tract: Normal adrenal glands. Scarring noted within the posterolateral upper pole of left kidney. No mass or hydronephrosis. Urinary bladder appears normal. Stomach/Bowel: Stomach is within normal limits. Appendix appears normal. No evidence of bowel wall thickening, distention, or inflammatory changes. Vascular/Lymphatic: Aortic atherosclerosis. No enlarged retroperitoneal or mesenteric adenopathy. No enlarged pelvic or inguinal lymph nodes. Reproductive: Prostate is unremarkable. Other: No abdominal wall hernia or abnormality. No abdominopelvic ascites. Musculoskeletal: Degenerative disc disease identified within the lumbar spine. Advanced cystic degenerative changes involve both hip joints, right greater than left. Review of the MIP images confirms the above findings. IMPRESSION: 1. No evidence for acute pulmonary embolus. 2. Trace pleural effusions, ground-glass attenuation and interstitial thickening compatible with mild pulmonary edema. 3. Small pulmonary nodules are unchanged from previous exam compatible with a benign process. 4. There is progressive mediastinal and hilar adenopathy. Further workup recommended. Consider pulmonary consultation, and/or PET-CT for further assessment. 5. No acute findings identified within the abdomen or pelvis. 6.  Aortic Atherosclerosis (ICD10-I70.0). Electronically Signed   By: Signa Kellaylor  Stroud M.D.   On: 06/24/2017  15:28   Ct Abdomen Pelvis W Contrast  Result Date: 06/24/2017 CLINICAL DATA:  Shortness of breath and chest pain.  Abdominal pain. EXAM: CT ANGIOGRAPHY CHEST CT ABDOMEN AND PELVIS WITH CONTRAST TECHNIQUE: Multidetector CT imaging of the chest was performed using the standard protocol during bolus administration of intravenous contrast. Multiplanar CT image reconstructions and MIPs were obtained to evaluate the vascular anatomy. Multidetector CT imaging of the abdomen and pelvis was performed using the standard protocol during bolus administration of intravenous contrast. CONTRAST:  100mL ISOVUE-370 IOPAMIDOL (ISOVUE-370) INJECTION 76% COMPARISON:  CTA chest 03/28/2013 FINDINGS: CTA CHEST FINDINGS Cardiovascular: Mild cardiac enlargement. Mild aortic atherosclerosis. Left chest wall ICD is noted with lead in the right atrial appendage, coronary sinus and right ventricle. The main pulmonary artery appears patent. No central obstructing pulmonary emboli identified. No lobar or segmental pulmonary artery filling defects identified. Mediastinum/Nodes: Normal appearance of the thyroid gland. The trachea appears patent and is midline. Normal appearance of the esophagus. Enlarged subcarinal lymph node measures 2.6 cm, image 41/9. Previously 1.6 cm. Right hilar lymph node measures 1.8 cm, image 46/9. Previously 1.6 cm. 2.6 cm right hilar lymph node is identified, image 35/9. Previously 2.1 cm. Right paratracheal lymph node measures 1.6 cm, image 29/9. Previously 0.7 cm. Left paratracheal lymph node measures 1.4 cm, image 28/9. Previously 0.6 cm. Lungs/Pleura: Small pleural effusions noted. There are diffuse bilateral ground-glass  opacifications and lower lobe predominant interstitial thickening noted. No airspace consolidation, atelectasis or pneumothorax. Right lower lobe perifissural nodule measures 5 mm, image 45/11. Stable from previous exam. Unchanged right middle lobe pulmonary nodule measuring 6 mm, image 43/11.  Musculoskeletal: Degenerative disc disease noted throughout the thoracic spine. Review of the MIP images confirms the above findings. CT ABDOMEN and PELVIS FINDINGS Hepatobiliary: No focal liver abnormality is seen. No gallstones, gallbladder wall thickening, or biliary dilatation. Pancreas: Unremarkable. No pancreatic ductal dilatation or surrounding inflammatory changes. Spleen: Normal in size without focal abnormality. Adrenals/Urinary Tract: Normal adrenal glands. Scarring noted within the posterolateral upper pole of left kidney. No mass or hydronephrosis. Urinary bladder appears normal. Stomach/Bowel: Stomach is within normal limits. Appendix appears normal. No evidence of bowel wall thickening, distention, or inflammatory changes. Vascular/Lymphatic: Aortic atherosclerosis. No enlarged retroperitoneal or mesenteric adenopathy. No enlarged pelvic or inguinal lymph nodes. Reproductive: Prostate is unremarkable. Other: No abdominal wall hernia or abnormality. No abdominopelvic ascites. Musculoskeletal: Degenerative disc disease identified within the lumbar spine. Advanced cystic degenerative changes involve both hip joints, right greater than left. Review of the MIP images confirms the above findings. IMPRESSION: 1. No evidence for acute pulmonary embolus. 2. Trace pleural effusions, ground-glass attenuation and interstitial thickening compatible with mild pulmonary edema. 3. Small pulmonary nodules are unchanged from previous exam compatible with a benign process. 4. There is progressive mediastinal and hilar adenopathy. Further workup recommended. Consider pulmonary consultation, and/or PET-CT for further assessment. 5. No acute findings identified within the abdomen or pelvis. 6.  Aortic Atherosclerosis (ICD10-I70.0). Electronically Signed   By: Signa Kell M.D.   On: 06/24/2017 15:28   Dg Chest Portable 1 View  Result Date: 06/24/2017 CLINICAL DATA:  Chest pain. EXAM: PORTABLE CHEST 1 VIEW COMPARISON:   05/05/2013 FINDINGS: Left chest wall ICD is noted with leads in the right atrial appendage, right ventricle and coronary sinus. Stable cardiac enlargement. No pleural effusion or edema. No airspace opacities. IMPRESSION: No active disease. Electronically Signed   By: Signa Kell M.D.   On: 06/24/2017 13:33    ____________________________________________   PROCEDURES  Procedure(s) performed:   Procedures  Critical Care performed:   ____________________________________________   INITIAL IMPRESSION / ASSESSMENT AND PLAN / ED COURSE  discussed patient in detail with Dr. Judithann Sheen the hospitalist and Dr. Ann Maki shows cardiology. They feel the patient does not really meet criteria for inpatient hospitalization. The patient's oxygen levels have been in the mid 90s except for 2 readings. Patient had elevated troponins at [facility] they're not significantly more elevated than they were. He does however have a pulmonary hilar adenopathy and will require follow-up for that. [facility] and have him follow-up with the prison doctor for referral to pulmonary medicine and primary care. I will give him some Lasix now and restart him on the 40 that he was taking before.         ____________________________________________   FINAL CLINICAL IMPRESSION(S) / ED DIAGNOSES  Final diagnoses:  Chest pain, unspecified type  Elevated troponin  Systolic congestive heart failure, unspecified HF chronicity (HCC)  Hilar adenopathy     ED Discharge Orders    None       Note:  This document was prepared using Dragon voice recognition software and may include unintentional dictation errors.    Arnaldo Natal, MD 06/24/17 1542    Arnaldo Natal, MD 06/24/17 1556

## 2017-06-24 NOTE — ED Triage Notes (Signed)
Pt to ED reporting left sided chest pain that started today with SOB, NV, dizziness, and lightheadedness. Pt reports pain radiates into right arm with numbness in right arm. 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 PT reports feelings as though the defibrillator fired after the pain started. While in triage pt had 1 episode of vomiting and 1 syncopal episode. Pt slumped over in wheelchair but did not fall out. No injury and pt became responsive again within 10 seconds with no changes in pain.

## 2017-06-24 NOTE — Discharge Instructions (Addendum)
please follow-up with pulmonary medicine to evaluate the hilar adenopathy. Please take your Lasix 40 mg once a day. Please follow-up with primary care to check on your congestive heart failure. It would probably be a good idea to follow-up with cardiology as well. Dr. Kirke CorinArida is on call. please return for any worsening symptoms. use Tylenol or Motrin up to 3 of the over-the-counter pills 3 times a day with food for the pain. If that doesn't work and urine to do it you can try the hydrocodone one pill up to 4 times a day if need be. Be careful that can make you woozy and constipated.
